# Patient Record
Sex: Female | Born: 1980 | Race: Black or African American | Hispanic: No | Marital: Single | Smoking: Former smoker
Health system: Southern US, Community
[De-identification: ages and names within clinical notes are randomized; demographics above are authoritative.]

## PROBLEM LIST (undated history)

## (undated) DIAGNOSIS — J45909 Unspecified asthma, uncomplicated: Secondary | ICD-10-CM

## (undated) DIAGNOSIS — F419 Anxiety disorder, unspecified: Secondary | ICD-10-CM

---

## 2015-03-03 ENCOUNTER — Emergency Department (HOSPITAL_COMMUNITY)
Admission: EM | Admit: 2015-03-03 | Discharge: 2015-03-03 | Disposition: A | Discharge status: Home / Self Care | Payer: Medicaid Other | Attending: Emergency Medicine | Admitting: Emergency Medicine

## 2015-03-03 ENCOUNTER — Encounter (HOSPITAL_COMMUNITY): Payer: Self-pay | Admitting: *Deleted

## 2015-03-03 ENCOUNTER — Emergency Department (HOSPITAL_COMMUNITY): Payer: Medicaid Other

## 2015-03-03 DIAGNOSIS — Z87891 Personal history of nicotine dependence: Secondary | ICD-10-CM | POA: Diagnosis not present

## 2015-03-03 DIAGNOSIS — R51 Headache: Secondary | ICD-10-CM | POA: Insufficient documentation

## 2015-03-03 DIAGNOSIS — J45901 Unspecified asthma with (acute) exacerbation: Secondary | ICD-10-CM | POA: Diagnosis not present

## 2015-03-03 DIAGNOSIS — Z8659 Personal history of other mental and behavioral disorders: Secondary | ICD-10-CM | POA: Diagnosis not present

## 2015-03-03 DIAGNOSIS — R519 Headache, unspecified: Secondary | ICD-10-CM

## 2015-03-03 HISTORY — DX: Unspecified asthma, uncomplicated: J45.909

## 2015-03-03 HISTORY — DX: Anxiety disorder, unspecified: F41.9

## 2015-03-03 MED ORDER — ALBUTEROL SULFATE (2.5 MG/3ML) 0.083% IN NEBU
2.5000 mg | INHALATION_SOLUTION | Freq: Once | RESPIRATORY_TRACT | Status: AC
  Administered 2015-03-03: 2.5 mg via RESPIRATORY_TRACT

## 2015-03-03 MED ORDER — ALBUTEROL SULFATE (2.5 MG/3ML) 0.083% IN NEBU
INHALATION_SOLUTION | RESPIRATORY_TRACT | Status: AC
  Filled 2015-03-03: qty 3

## 2015-03-03 MED ORDER — ALBUTEROL SULFATE (2.5 MG/3ML) 0.083% IN NEBU
2.5000 mg | INHALATION_SOLUTION | Freq: Once | RESPIRATORY_TRACT | Status: AC
  Administered 2015-03-03: 2.5 mg via RESPIRATORY_TRACT
  Filled 2015-03-03: qty 3

## 2015-03-03 MED ORDER — HYDROCODONE-HOMATROPINE 5-1.5 MG/5ML PO SYRP: 5.0000 mL | ORAL_SOLUTION | Freq: Four times a day (QID) | ORAL | Status: DC | PRN

## 2015-03-03 MED ORDER — HYDROCODONE-HOMATROPINE 5-1.5 MG/5ML PO SYRP
5.0000 mL | ORAL_SOLUTION | Freq: Once | ORAL | Status: AC
  Administered 2015-03-03: 5 mL via ORAL
  Filled 2015-03-03: qty 5

## 2015-03-03 MED ORDER — ALBUTEROL SULFATE HFA 108 (90 BASE) MCG/ACT IN AERS
2.0000 | INHALATION_SPRAY | RESPIRATORY_TRACT | Status: DC
  Administered 2015-03-03: 2 via RESPIRATORY_TRACT
  Filled 2015-03-03: qty 6.7

## 2015-03-03 MED ORDER — PREDNISONE 10 MG (21) PO TBPK: 10.0000 mg | ORAL_TABLET | Freq: Every day | ORAL | Status: DC

## 2015-03-03 NOTE — Discharge Instructions (Signed)
Asthma Follow up with a primary care physician using the resource guide below. Return for fever or shortness of breath. Asthma is a condition of the lungs in which the airways tighten and narrow. Asthma can make it hard to breathe. Asthma cannot be cured, but medicine and lifestyle changes can help control it. Asthma may be started (triggered) by:  Animal skin flakes (dander).  Dust.  Cockroaches.  Pollen.  Mold.  Smoke.  Cleaning products.  Hair sprays or aerosol sprays.  Paint fumes or strong smells.  Cold air, weather changes, and winds.  Crying or laughing hard.  Stress.  Certain medicines or drugs.  Foods, such as dried fruit, potato chips, and sparkling grape juice.  Infections or conditions (colds, flu).  Exercise.  Certain medical conditions or diseases.  Exercise or tiring activities. HOME CARE   Take medicine as told by your doctor.  Use a peak flow meter as told by your doctor. A peak flow meter is a tool that measures how well the lungs are working.  Record and keep track of the peak flow meter's readings.  Understand and use the asthma action plan. An asthma action plan is a written plan for taking care of your asthma and treating your attacks.  To help prevent asthma attacks:  Do not smoke. Stay away from secondhand smoke.  Change your heating and air conditioning filter often.  Limit your use of fireplaces and wood stoves.  Get rid of pests (such as roaches and mice) and their droppings.  Throw away plants if you see mold on them.  Clean your floors. Dust regularly. Use cleaning products that do not smell.  Have someone vacuum when you are not home. Use a vacuum cleaner with a HEPA filter if possible.  Replace carpet with wood, tile, or vinyl flooring. Carpet can trap animal skin flakes and dust.  Use allergy-proof pillows, mattress covers, and box spring covers.  Wash bed sheets and blankets every week in hot water and dry them in a  dryer.  Use blankets that are made of polyester or cotton.  Clean bathrooms and kitchens with bleach. If possible, have someone repaint the walls in these rooms with mold-resistant paint. Keep out of the rooms that are being cleaned and painted.  Wash hands often. GET HELP IF:  You have make a whistling sound when breaking (wheeze), have shortness of breath, or have a cough even if taking medicine to prevent attacks.  The colored mucus you cough up (sputum) is thicker than usual.  The colored mucus you cough up changes from clear or white to yellow, green, gray, or bloody.  You have problems from the medicine you are taking such as:  A rash.  Itching.  Swelling.  Trouble breathing.  You need reliever medicines more than 2-3 times a week.  Your peak flow measurement is still at 50-79% of your personal best after following the action plan for 1 hour.  You have a fever. GET HELP RIGHT AWAY IF:   You seem to be worse and are not responding to medicine during an asthma attack.  You are short of breath even at rest.  You get short of breath when doing very little activity.  You have trouble eating, drinking, or talking.  You have chest pain.  You have a fast heartbeat.  Your lips or fingernails start to turn blue.  You are light-headed, dizzy, or faint.  Your peak flow is less than 50% of your personal best. MAKE SURE  YOU:   Understand these instructions.  Will watch your condition.  Will get help right away if you are not doing well or get worse. Document Released: 11/09/2007 Document Revised: 10/07/2013 Document Reviewed: 12/20/2012 [facility] Patient Information 2015 [place], [place]. This information is not intended to replace advice given to you by your health care provider. Make sure you discuss any questions you have with your health care provider.  Emergency Department Resource Guide 1) Find a Doctor and Pay Out of Pocket Although you won't have to find out  who is covered by your insurance plan, it is a good idea to ask around and get recommendations. You will then need to call the office and see if the doctor you have chosen will accept you as a new patient and what types of options they offer for patients who are self-pay. Some doctors offer discounts or will set up payment plans for their patients who do not have insurance, but you will need to ask so you aren't surprised when you get to your appointment.  2) Contact Your Local Health Department Not all health departments have doctors that can see patients for sick visits, but many do, so it is worth a call to see if yours does. If you don't know where your local health department is, you can check in your phone book. The CDC also has a tool to help you locate your state's health department, and many state websites also have listings of all of their local health departments.  3) Find a Walk-in Clinic If your illness is not likely to be very severe or complicated, you may want to try a walk in clinic. These are popping up all over the country in pharmacies, drugstores, and shopping centers. They're usually staffed by nurse practitioners or physician assistants that have been trained to treat common illnesses and complaints. They're usually fairly quick and inexpensive. However, if you have serious medical issues or chronic medical problems, these are probably not your best option.  No Primary Care Doctor: - Call Health Connect at  [phone] - they can help you locate a primary care doctor that  accepts your insurance, provides certain services, etc. - Physician Referral Service- [phone]  Chronic Pain Problems: Organization         Address  Phone   Notes  [facility]  [phone] Patients need to be referred by their primary care doctor.   Medication Assistance: Organization         Address  Phone   Notes  [facility] Medication [facility] [address] [place], [place] [address] [phone] --Must be a resident of [facility] -- Must have NO insurance coverage whatsoever (no Medicaid/ Medicare, etc.) -- The pt. MUST have a primary care doctor that directs their care regularly and follows them in the community   [facility]  [phone]   [employer]  [phone]    Agencies that provide inexpensive medical care: Organization         Address  Phone   Notes  [facility]  [phone]   [facility]    [phone]   [facility] [address] [place], [place] [address] [phone]   [facility] [address] [place] [place] [phone]   Planned Parenthood    [phone]   [facility]    [phone]   [facility]  [address], [place] Phone:  [phone], Fax:  [phone] Hours of Operation:  9 am - 6 pm, M-F.  Also accepts Medicaid/Medicare and self-pay.  [facility] for Children  [address], [place] Phone: [phone], Fax: [phone]. Hours of Operation:  8:30 am - 5:30 pm, M-F.  Also accepts Medicaid and self-pay.  [facility] [place] [address] [place] Phone: [phone]   [facility] [address] Natasha Bence [place], [place] [phone], Ext. [phone] Mondays & Thursdays: 7-9 AM.  First 15 patients are seen on a first come, first serve basis.    Medicaid-accepting [facility] Providers:  Organization         Address  Phone   Notes  [facility] [address], [place] [phone] Also accepts self-pay patients.  [facility] [address] Laurell Josephs [place], [place]  [phone]   [facility] [address], [place] [phone]   [facility] [address], [place] [phone]   Renaye Rakers  [address], [place]   [phone] Only accepts [place] Access [place] patients after they have their name applied to their card.   Self-Pay (no insurance) in [facility]:  Organization         Address  Phone   Notes  Sickle Cell Patients, [facility] [address] [place], [place] [phone]   [facility] Care [address] [place], [place] [phone]   [facility] [place]  [address], [place] [phone]   [facility]/Dr. Osei-Bonsu  [address], [place] or [address], [place] [phone] Phone number for both [place] and [place] locations is the same.  [facility] [address], [place] [phone]   [facility] [address] [place] or [address] [phone]   [facility] [address], [place] [phone], phone; [phone], fax Sees patients 1st and 3rd Saturday of every month.  Must not qualify for public or private insurance (i.e. Medicaid, Medicare, [place] Health Choice, Veterans' Benefits)  Household income should be no more than 200% of the poverty level The clinic cannot treat you if you are pregnant or think you are pregnant  Sexually transmitted diseases are not treated at the clinic.    Dental Care: Organization         Address  Phone  Notes  [facility] Department of [facility] [address] [place], [place] [phone] Accepts children up to age 24 who are enrolled in [place] or [place] Health Choice; pregnant women with a Medicaid card; and children who have applied for Medicaid or [place] Health Choice, but were declined, whose parents can pay a reduced fee at time of service.  [facility] Department of [facility]  [address], [place] [phone] Accepts children up to age 52  who are enrolled in [place] or [place] Health Choice; pregnant women with a Medicaid card; and children who have applied for Medicaid or [place] Health Choice, but were declined, whose parents can pay a reduced fee at time of service.  [facility]  [address] [place], [place] (  [phone] Patients are seen by appointment only. Walk-ins are not accepted. [facility] will see patients 50 years of age and older. Monday - Tuesday (8am-5pm) Most Wednesdays (8:30-5pm) $30 per visit, cash only  [facility] Adult [facility]  [address] [place], [facility] [phone] Patients are seen by appointment only. Walk-ins are not accepted. [facility] will see patients 72 years of age and older. One Wednesday Evening (Monthly: Volunteer Based).  $30 per visit, cash only  [employer]  [phone] for adults; Children under age 46, call Graduate Pediatric Dentistry at [phone]. Children aged 7-14, please call [phone] to request a pediatric application.  Dental services are provided in all areas of dental care including fillings, crowns and bridges, complete and partial dentures, implants, gum treatment, root canals, and extractions. Preventive care is also provided. Treatment is provided to both adults and children. Patients are selected via a lottery and there is often a waiting list.   [facility] [address] [place]  [phone] [email or web address]   [facility] [address] [place], [place] [phone], Ext. [phone] Second and Fourth Thursday of each month, opens at 6:30 AM; Clinic ends at 9 AM.  Patients are seen on a first-come first-served basis, and a limited number are seen during each clinic.   [facility]  [address] [place] [address] Ether Griffins [place], [place] [phone]   Eligibility Requirements You must have lived in [place], [place], or [place] counties for at least the last three months.    You cannot be eligible for state or federal sponsored [place], including [employer], [place], or [employer].   You generally cannot be eligible for healthcare insurance through your employer.    How to apply: Eligibility screenings are held every Tuesday and Wednesday afternoon from 1:00 pm until 4:00 pm. You do not need an appointment for the interview!  [facility] [address], [place], [place] [phone]   [facility] Health Department  [phone]   [facility] Health Department  [phone]   [facility] Department  [phone]    Behavioral Health Resources in the Community: Intensive Outpatient Programs Organization         Address  Phone  Notes  [facility] Services [address] [place], [place] [phone]   [facility] Outpatient [address] [place], [place] [phone]   [facility] [address] [place], [place]  [phone]   [facility] Mental Health [address],  [place], [place] [phone] or [phone]   Substance Abuse Resources Organization         Address  Phone  Notes  [facility]  [phone]   [facility]  [phone]   [facility]  [phone]   Floydene Flock  [phone]   Residential & Outpatient Substance Abuse Program  [phone]   Psychological Services Organization         Address  Phone  Notes  [facility] Health  [phone]   [facility] Services  [phone]   [facility] Mental Health [address] [place] [phone] or [phone]    Mobile Crisis Teams Organization         Address  Phone  Notes  [facility], Mobile Crisis Care Unit  [phone]   [facility]  [address] [place], [place] [phone]   Doristine Locks [address] [place] [phone]    Self-Help/Support  Groups Organization         Address  Phone             Notes  [facility] [place] - variety of support groups  336- I7437963 Call for more information  Narcotics Anonymous (NA), [facility] [address], [employer] [place]  2 meetings at this location   Statistician         Address  Phone  Notes  [facility] [address],    [place]  [phone]   [facility]  [address], [place] [address], [place], [place] [phone]   [facility] [address] [place], [place] [phone] Admissions: 8am-3pm M-F  [facility] [address],    [place], [place] [phone]   [facility] [address] [place], [place], [place] [phone]   [facility] [address],  [place], [place] [phone]   [facility] - Intensive Outpatient [address] Laurell Joseph[address], [place], [place] [phone]   [facility] ([facility] [address] [place]  [place], [place] [phone] or [phone]   [facility] ([facility]) [address], [place], [place] [phone] Accepts Medicaid  [facility] [address],  [place] [phone] Substance Abuse/Addiction Treatment   [facility] Organization         Address  Phone  Notes  [facility]  [phone]   Angie Fava, PhD [address] Ervin Knack [place], [place]   [phone] or [phone]   [facility]   [address] [place], [place] [phone]   [facility] [address] [place], [place] [phone] Insurance/Medicaid/sponsorship through [facility] [address]                                    [place], [place] [phone] Therapy/tele-psych/case  [facility] [address], [place] [phone]    Dr. Lolly Mustache  [phone]   [facility]  [employer] [facility] Dept. 1) [address] [place] 2) [address], [place] 3)  [address], [place] [phone]  [phone]   [facility] Child Abuse Hotline (534)402-7727 or [phone] (After Hours)

## 2015-03-03 NOTE — ED Provider Notes (Signed)
CSN: [id number]     Arrival date & time 03/03/15  1735 History  By signing my name below, I, Emmanuella Mensah, attest that this documentation has been prepared under the direction and in the presence of Hanna Mills-Patel, PA-C. Electronically Signed: Angelene Giovanni, ED Scribe. 03/04/2015. 6:27 PM.    Chief Complaint  Patient presents with  . Cough  . Migraine   The history is provided by the patient. No language interpreter was used.   HPI Comments: Caroline Boyd is a 34 y.o. female with ah hx of asthma and headaches who presents to the Emergency Department complaining of moderate gradually worsening productive cough with yellow then clear sputum and migraine onset 4 days ago. She reports associated photophobia. She denies any fever. She reports that she took Tylenol this am and Excedrin in the afternoon PTA. She adds that she took Web designer to clear up the mucous in her chest. She states that she has seasonal asthmatic flare ups. She has been taking her albuterol inhaler more than she normally does today and yesterday. She denies any fever, neck pain, chills, sore throat.  Past Medical History  Diagnosis Date  . Asthma   . Anxiety     by pt report   Past Surgical History  Procedure Laterality Date  . Cesarean section     No family history on file. Social History  Substance Use Topics  . Smoking status: Former Games developer  . Smokeless tobacco: None  . Alcohol Use: Yes     Comment: occassionally   OB History    No data available     Review of Systems  Constitutional: Negative for fever and chills.  Respiratory: Positive for cough. Negative for shortness of breath and wheezing.   Gastrointestinal: Negative for nausea.  Neurological: Positive for headaches.      Allergies  Review of patient's allergies indicates not on file.  Home Medications   Prior to Admission medications   Medication Sig Start Date End Date Taking? Authorizing Provider   HYDROcodone-homatropine (HYCODAN) 5-1.5 MG/5ML syrup Take 5 mLs by mouth every 6 (six) hours as needed for cough. 03/03/15   Hanna Patel-Mills, PA-C  predniSONE (STERAPRED UNI-PAK 21 TAB) 10 MG (21) TBPK tablet Take 1 tablet (10 mg total) by mouth daily. Take 6 tabs by mouth daily  for 2 days, then 5 tabs for 2 days, then 4 tabs for 2 days, then 3 tabs for 2 days, 2 tabs for 2 days, then 1 tab by mouth daily for 2 days 03/03/15   Lorelle Formosa Patel-Mills, PA-C   BP 132/81 mmHg  Pulse 105  Temp(Src) 98.3 F (36.8 C) (Oral)  Resp 18  SpO2 100%  LMP 02/25/2015 Physical Exam  Constitutional: She is oriented to person, place, and time. She appears well-developed and well-nourished.  Appears uncomfortable and coughing on exam  HENT:  Head: Normocephalic and atraumatic.  Eyes: Conjunctivae are normal.  Photophobia  Neck: Normal range of motion. Neck supple.  Full ROM of neck without difficulty or pain  Cardiovascular: Normal rate, regular rhythm and normal heart sounds.   Pulmonary/Chest: Effort normal and breath sounds normal. No respiratory distress. She has no wheezes. She has no rales.  Clear to auscultation bilaterally. No wheezing or decreased breath sounds.  Abdominal: She exhibits no distension.  Musculoskeletal: Normal range of motion.  Neurological: She is alert and oriented to person, place, and time.  Skin: Skin is warm and dry.  Psychiatric: She has a normal mood and affect.  Nursing note and vitals reviewed.   ED Course  Procedures (including critical care time) DIAGNOSTIC STUDIES: Oxygen Saturation is 100% on RA, normal by my interpretation.    COORDINATION OF CARE: 6:23 PM- Pt advised of plan for treatment and pt agrees.    Labs Review Labs Reviewed - No data to display  Imaging Review Dg Chest 2 View  03/03/2015   CLINICAL DATA:  Chest pain for 3-4 days.  EXAM: CHEST  2 VIEW  COMPARISON:  None.  FINDINGS: The heart size and mediastinal contours are within normal limits.  Both lungs are clear. The visualized skeletal structures are unremarkable.  IMPRESSION: No active cardiopulmonary disease.   Electronically Signed   By: Elige Ko   On: 03/03/2015 19:30      EKG Interpretation None      MDM   Final diagnoses:  Asthma exacerbation  Acute nonintractable headache, unspecified headache type   Patient presents for cough and headache. She is well-appearing and in no acute distress. She is 100% on room air. Afebrile. Chest xray is negative for pneumonia or pnx.  I believe this is an asthma exacerbation. She has a history of and she states her headache is similar to ones she has had in the past. She refused pain medication for her headache and stated she would take Advil or Excedrin when she got home. Medications  albuterol (PROVENTIL) (2.5 MG/3ML) 0.083% nebulizer solution 2.5 mg (2.5 mg Nebulization Given 03/03/15 1832)  HYDROcodone-homatropine (HYCODAN) 5-1.5 MG/5ML syrup 5 mL (5 mLs Oral Given 03/03/15 1848)  albuterol (PROVENTIL) (2.5 MG/3ML) 0.083% nebulizer solution 2.5 mg (2.5 mg Nebulization Given 03/03/15 1832)  Recheck: Patient states she is feeling much better than when she arrived. She has no longer coughing. No decreased breath sounds or wheezing. I discussed return precautions with the patient as well as follow-up and she verbally agrees with the plan.  I personally performed the services described in this documentation, which was scribed in my presence. The recorded information has been reviewed and is accurate.   Catha Gosselin, PA-C 03/04/15 1304  Lyndal Pulley, MD 03/04/15 [phone]

## 2015-03-03 NOTE — ED Notes (Signed)
Pt states "I just moved here, I had flu-like on Friday and started coughing.  I ran out of my inhaler today.  I think because of this coughing I have the migraine.  I just take excedrin or Xtra strength tylenol".

## 2016-07-17 ENCOUNTER — Emergency Department
Admission: EM | Admit: 2016-07-17 | Discharge: 2016-07-17 | Discharge status: Absent without leave | Payer: Medicaid Other

## 2016-09-04 IMAGING — CR DG CHEST 2V
2 series · 2 of 2 positions shown · non-contrast
Comparison: None.

CLINICAL DATA: Chest pain for 3-4 days.

EXAM:
CHEST  2 VIEW

[w chest pa]
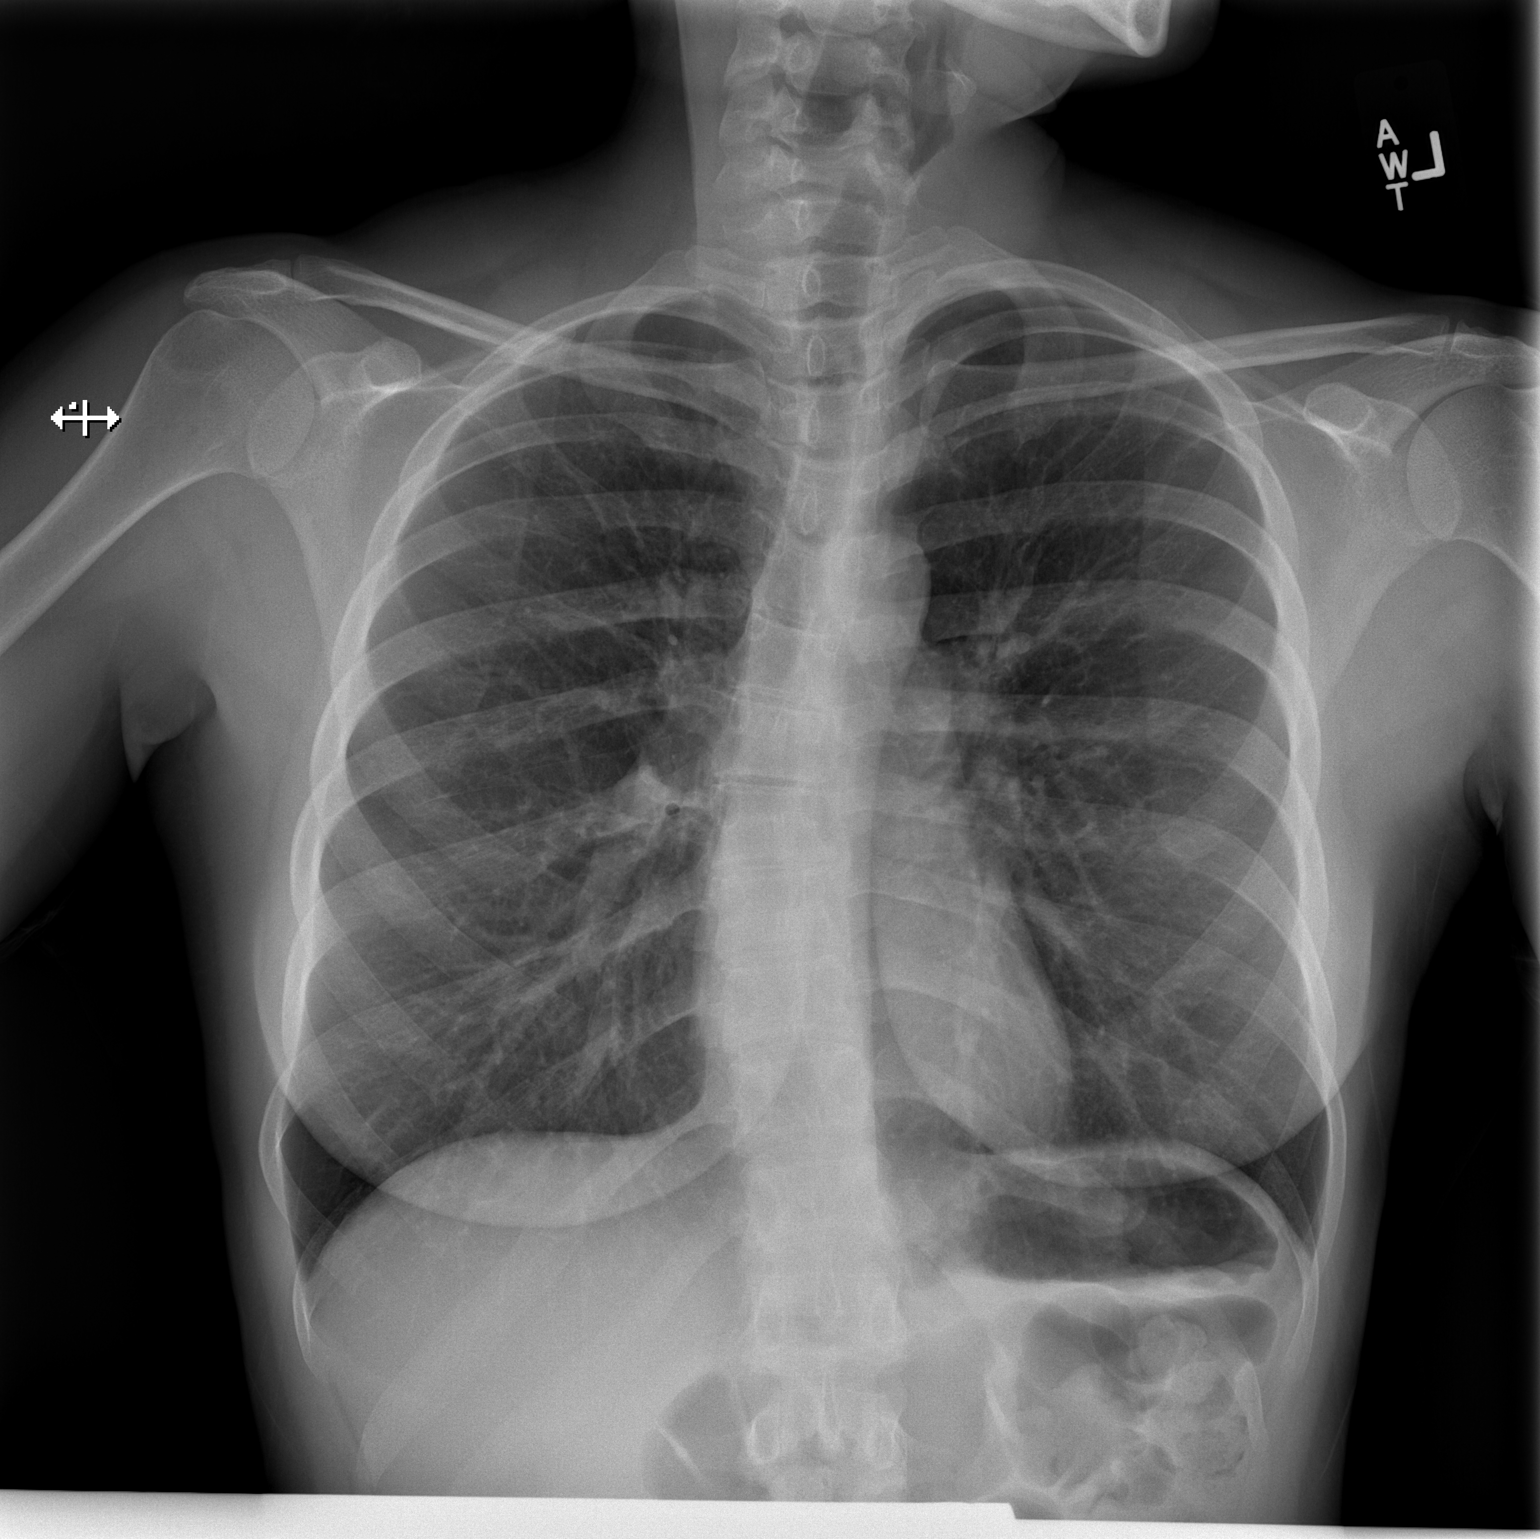

[w chest lat]
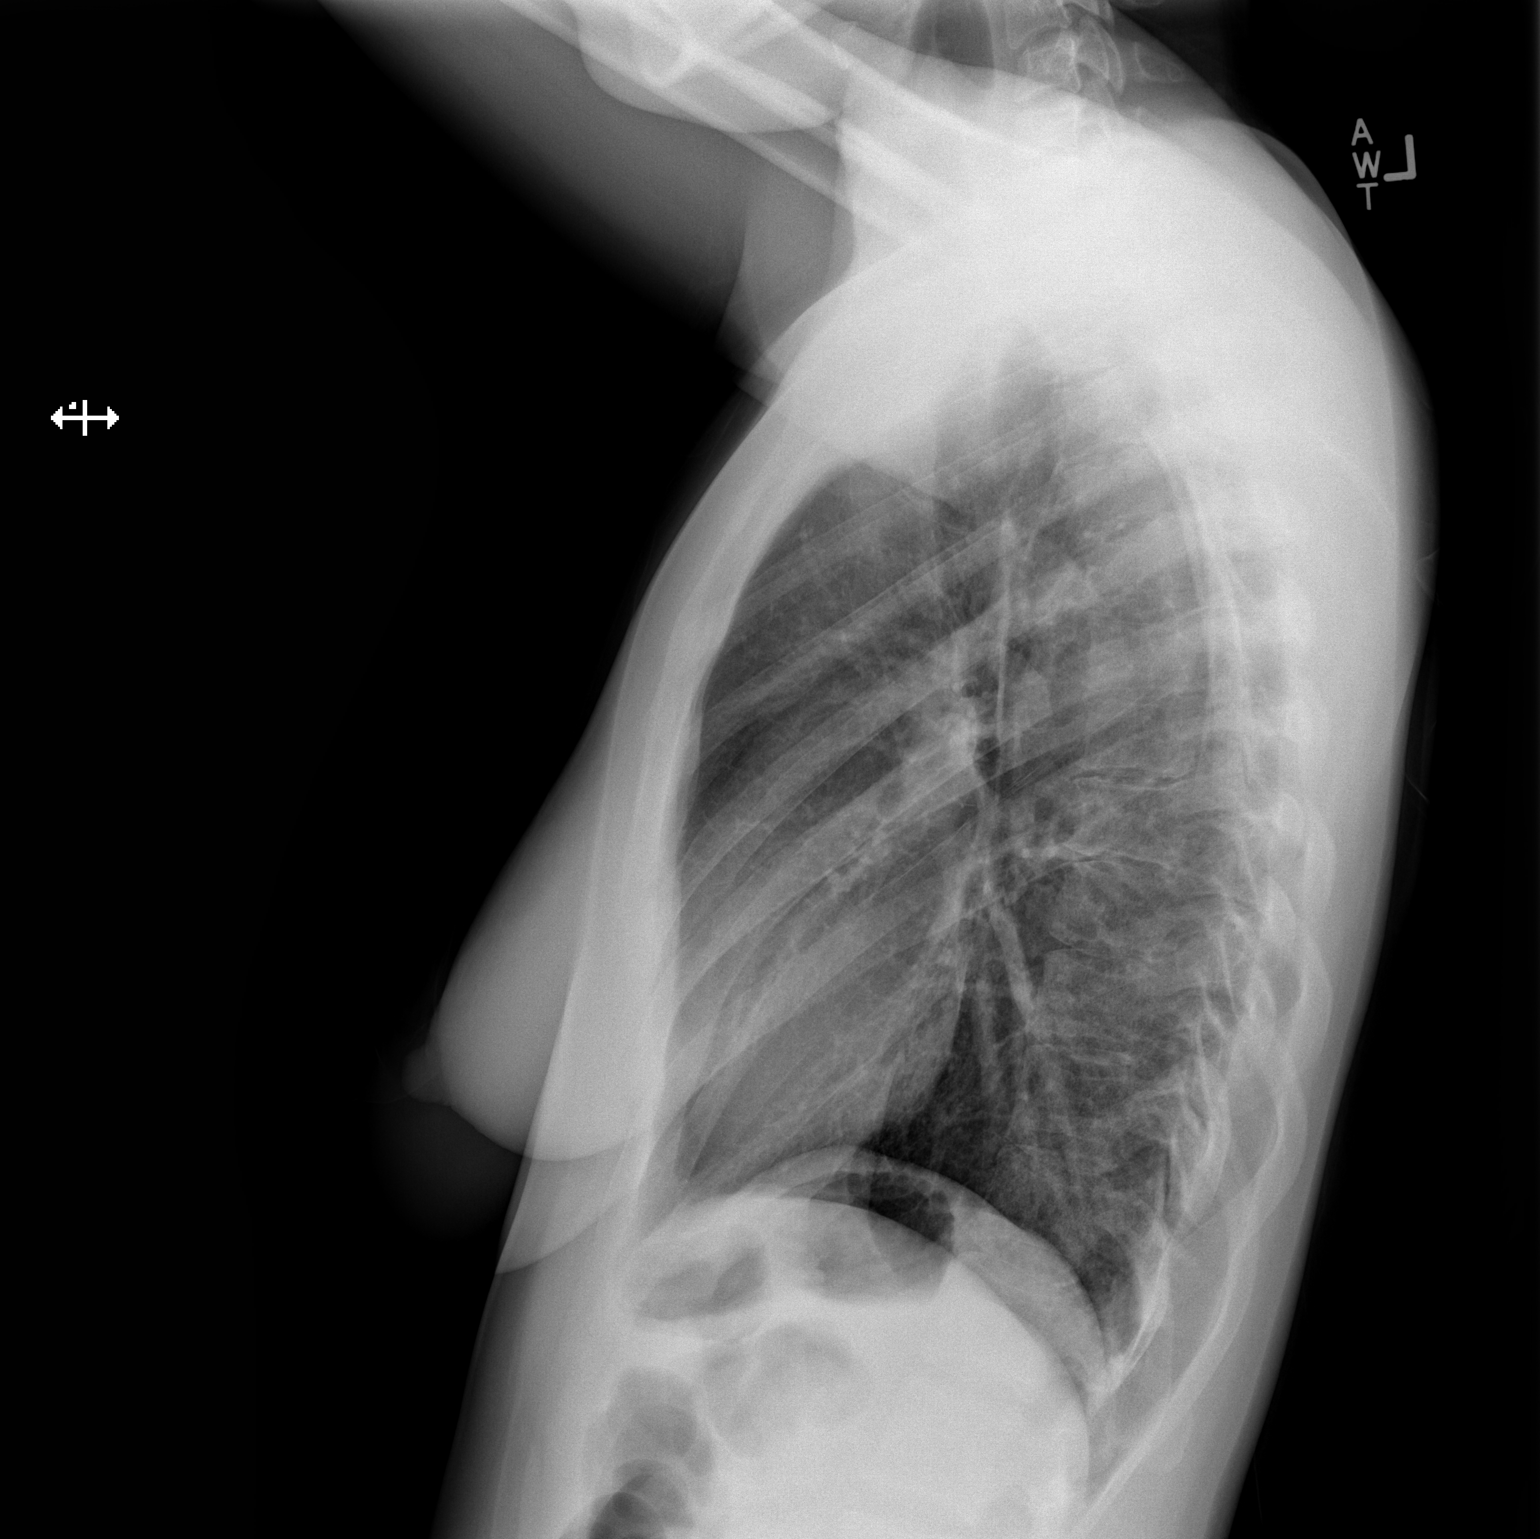

[2 of 2 positions shown; findings below may reference images not displayed]

FINDINGS: The heart size and mediastinal contours are within normal limits.
Both lungs are clear. The visualized skeletal structures are
unremarkable.
IMPRESSION: No active cardiopulmonary disease.

## 2017-07-01 ENCOUNTER — Other Ambulatory Visit: Payer: Self-pay

## 2017-07-01 ENCOUNTER — Ambulatory Visit
Admission: EM | Admit: 2017-07-01 | Discharge: 2017-07-01 | Disposition: A | Discharge status: Home / Self Care | Payer: Medicaid Other | Attending: Family Medicine | Admitting: Family Medicine

## 2017-07-01 ENCOUNTER — Encounter: Payer: Self-pay | Admitting: Gynecology

## 2017-07-01 DIAGNOSIS — L03211 Cellulitis of face: Secondary | ICD-10-CM

## 2017-07-01 MED ORDER — DOXYCYCLINE HYCLATE 100 MG PO CAPS: 100.0000 mg | ORAL_CAPSULE | Freq: Two times a day (BID) | ORAL | 0 refills | Status: AC

## 2017-07-01 NOTE — ED Triage Notes (Signed)
Per patient right nostril swollen. Patient stated notice x 5-6 days ago.

## 2017-07-01 NOTE — ED Provider Notes (Signed)
[facility]    CSN: [id number] Arrival date & time: 07/01/17  0946   History   Chief Complaint Chief Complaint  Patient presents with  . Facial Swelling   HPI   37 year old female presents with swelling and redness of her right nostril.  Patient also reports some pain of her "glands" on the right side.  Started Monday.  Sudden onset.  She noticed this after she blew her nose vigorously.  Right nostril has continued to be swollen and mildly tender.  No medications or interventions tried.  Additionally, she reports that she has some right-sided neck discomfort which she states is "a swollen gland".  No sore throat.  No other respiratory symptoms.  No other complaints or concerns at this time.  Past Medical History:  Diagnosis Date  . Anxiety    by pt report  . Asthma    Past Surgical History:  Procedure Laterality Date  . CESAREAN SECTION     OB History    No data available     Family History Family History  Problem Relation Age of Onset  . Asthma Father     Social History Social History   Tobacco Use  . Smoking status: Former Games developermoker  . Smokeless tobacco: Never Used  Substance Use Topics  . Alcohol use: Yes    Comment: occassionally  . Drug use: No    Comment: smoked marijuana formerly    Allergies   Patient has no known allergies.   Review of Systems Review of Systems  Constitutional: Negative.   HENT:       Right nostril swelling.  Hematological: Positive for adenopathy.   Physical Exam Triage Vital Signs ED Triage Vitals  Enc Vitals Group     BP 07/01/17 1033 107/68     Pulse Rate 07/01/17 1033 74     Resp 07/01/17 1033 16     Temp 07/01/17 1033 98.2 F (36.8 C)     Temp Source 07/01/17 1033 Oral     SpO2 07/01/17 1033 100 %     Weight 07/01/17 1032 130 lb (59 kg)     Height 07/01/17 1032 5\' 3"  (1.6 m)     Head Circumference --      Peak Flow --      Pain Score 07/01/17 1032 2     Pain Loc --      Pain Edu? --      Excl. in  GC? --    Updated Vital Signs BP 107/68 (BP Location: Left Arm)   Pulse 74   Temp 98.2 F (36.8 C) (Oral)   Resp 16   Ht 5\' 3"  (1.6 m)   Wt 130 lb (59 kg)   LMP 07/01/2017   SpO2 100%   BMI 23.03 kg/m   Physical Exam  Constitutional: She is oriented to person, place, and time. She appears well-developed and well-nourished. No distress.  HENT:  Head: Normocephalic and atraumatic.  Mouth/Throat: Oropharynx is clear and moist.  Right nostril with edema and erythema.  Firm.  Eyes: Conjunctivae are normal. Right eye exhibits no discharge. Left eye exhibits no discharge.  Neck: Neck supple.  Cardiovascular: Normal rate and regular rhythm.  No murmur heard. Pulmonary/Chest: Effort normal and breath sounds normal. She has no wheezes. She has no rales.  Lymphadenopathy:    She has no cervical adenopathy.  Neurological: She is alert and oriented to person, place, and time.  Skin: Skin is warm. No rash noted.  Psychiatric:  Flat  affect.  Depressed mood.  Nursing note and vitals reviewed.  UC Treatments / Results  Labs (all labs ordered are listed, but only abnormal results are displayed) Labs Reviewed - No data to display  EKG  EKG Interpretation None       Radiology No results found.  Procedures Procedures (including critical care time)  Medications Ordered in UC Medications - No data to display   Initial Impression / Assessment and Plan / UC Course  I have reviewed the triage vital signs and the nursing notes.  Pertinent labs & imaging results that were available during my care of the patient were reviewed by me and considered in my medical decision making (see chart for details).     37 year old female presents with swelling and redness of her right nostril.  Appears to be more consistent with cellulitis as opposed to vestibulitis.  No adenopathy was noted on exam.  She is otherwise well-appearing.  Treating with doxycycline.  Final Clinical Impressions(s) /  UC Diagnoses   Final diagnoses:  Cellulitis of face    ED Discharge Orders        Ordered    doxycycline (VIBRAMYCIN) 100 MG capsule  2 times daily     07/01/17 1121     Controlled Substance Prescriptions [place] Controlled Substance Registry consulted? Not Applicable   Tommie Sams, DO 07/01/17 1135

## 2017-07-01 NOTE — Discharge Instructions (Signed)
Antibiotic as prescribed.  Take care  Dr. Jesalyn Finazzo  

## 2017-07-04 ENCOUNTER — Telehealth: Payer: Self-pay | Admitting: *Deleted

## 2017-07-04 NOTE — Telephone Encounter (Signed)
Called patient, no answer, left message advising patient to follow up with PCP if symptoms persist.
